# Patient Record
Sex: Female | Born: 1970 | Race: White | Hispanic: No | Marital: Married | Smoking: Never smoker
Health system: Southern US, Community
[De-identification: ages and names within clinical notes are randomized; demographics above are authoritative.]

## PROBLEM LIST (undated history)

## (undated) DIAGNOSIS — K219 Gastro-esophageal reflux disease without esophagitis: Secondary | ICD-10-CM

## (undated) DIAGNOSIS — R51 Headache: Secondary | ICD-10-CM

## (undated) DIAGNOSIS — O139 Gestational [pregnancy-induced] hypertension without significant proteinuria, unspecified trimester: Secondary | ICD-10-CM

## (undated) HISTORY — PX: WRIST SURGERY: SHX841

---

## 2012-11-10 ENCOUNTER — Telehealth: Payer: Self-pay | Admitting: Family Medicine

## 2012-11-11 NOTE — Telephone Encounter (Signed)
done

## 2012-11-21 ENCOUNTER — Other Ambulatory Visit: Payer: Self-pay

## 2012-11-24 ENCOUNTER — Encounter (HOSPITAL_COMMUNITY): Payer: Self-pay | Admitting: Obstetrics and Gynecology

## 2012-11-29 ENCOUNTER — Ambulatory Visit: Payer: Self-pay | Admitting: Family Medicine

## 2012-11-30 ENCOUNTER — Ambulatory Visit (HOSPITAL_COMMUNITY)
Admission: RE | Admit: 2012-11-30 | Discharge: 2012-11-30 | Disposition: A | Discharge status: Home / Self Care | Payer: BC Managed Care – PPO | Source: Ambulatory Visit | Attending: Obstetrics and Gynecology | Admitting: Obstetrics and Gynecology

## 2012-11-30 NOTE — Progress Notes (Signed)
Genetic Counseling  High-Risk Gestation Note  Appointment Date:  11/30/2012 Referred By: Zelphia Cairo, MD Date of Birth:  Mar 18, 1971 Partner:  Charlyne Petrin   Pregnancy History: G2P1 Estimated Date of Delivery: 07/04/13 Estimated Gestational Age: [redacted]w[redacted]d Attending: Eulis Foster, MD   Ms. Lasandra Beech and her husband, Mr. Charlyne Petrin, were seen for genetic counseling because of a maternal age of 42 y.o.Marland Kitchen     They were counseled regarding maternal age and the association with risk for chromosome conditions due to nondisjunction with aging of the ova.   We reviewed chromosomes, nondisjunction, and the associated 1 in 12 risk for fetal aneuploidy related to a maternal age of 42 y.o. at [redacted]w[redacted]d gestation.  They were counseled that the risk for aneuploidy decreases as gestational age increases, accounting for those pregnancies which spontaneously abort.  We specifically discussed Down syndrome (trisomy 7), trisomies 5 and 70, and sex chromosome aneuploidies (47,XXX and 47,XXY) including the common features and prognoses of each.   We reviewed available screening options including First Screen, Quad screen, noninvasive prenatal screening (NIPS)/cell free fetal DNA (cffDNA) testing, and detailed ultrasound.  They were counseled that screening tests are used to modify a patient's a priori risk for aneuploidy, typically based on age. This estimate provides a pregnancy specific risk assessment. We reviewed the benefits and limitations of each option. Specifically, we discussed the conditions for which each test screens, the detection rates, and false positive rates of each. They were also counseled regarding diagnostic testing via CVS and amniocentesis. We reviewed the associated risks for complications for each, including spontaneous pregnancy loss. After consideration of all the options, they were undecided regarding which option they would like to pursue.  They elected to discuss this and will notify me with  their preferences.  We again reviewed the major differences between cffDNA testing (specifically, Panorama testing) and CVS.  We also discussed the option of a nuchal translucency ultrasound at ~11-[redacted] weeks gestation and a detailed anatomy ultrasound at ~18+ weeks gestation.  They expressed interest in both.   We discussed that these ultrasounds can be performed through her primary obstetrician's office, or our office, if she prefers.   This couple was then counseled regarding Mr. Rowe's paternal age of 72.  We discussed that advanced paternal age (APA) is defined as paternal age greater than or equal to age 47.  Recent large-scale sequencing studies have shown that approximately 80% of de novo point mutations are of paternal origin.  Many studies have demonstrated a strong correlation between increased paternal age and de novo point mutations.  Although no specific data is available regarding fetal risks for fathers 2+ years old at conception, it is apparent that the overall risk for single gene conditions is increased.  To estimate the relative increase in risk of a genetic disorder with APA, the heritability of the disease must be considered.  Assuming an approximate 2x increase in risk for conditions that are exclusively paternal in origin, the risk for each individual condition is still relatively low.  It is estimated that the overall chance for a de novo mutation is ~0.5%.  We also discussed the wide range of conditions which can be caused by new dominant gene mutations (achondroplasia, neurofibromatosis, Marfan syndrome etc.).  They were counseled that genetic testing for each individual single gene condition is not warranted or available unless ultrasound or family history concerns lend suspicion to a specific condition.    In addition, we discussed that newer literature suggests that the  risk for autism spectrum disorders (ASD) may be increased in children born to fathers of APA.  We discussed that  ASDs are among the most common neurodevelopmental disorders, with approximately 1 in 85 children meeting criteria for ASD.  Approximately 80% of individuals diagnosed are female.  There is strong evidence that genetic factors play a critical role in development of ASD.  While there have been recent advances in identifying specific genetic causes of ASD, there are still many individuals for whom the etiology of the ASD is not known.  They understand that at this time there is no reliable, comprehensive genetic testing available for ASD.     Both family histories were reviewed and found to be contributory for Ms. Schifano's brother having mild intellectual disability of unknown etiology.  By report, he has some type of spinal column difference that contributes to his learning disability.  They were counseled that there are many different causes of intellectual disabilities including environmental, multifactorial, and genetic etiologies.  We discussed that a specific diagnosis for intellectual disability can be determined in approximately 50% of these individuals.  In the remaining 50% of individuals, a diagnosis may never be determined.  Regarding genetic causes, we discussed that chromosome aberrations (aneuploidy, deletions, duplications, insertions, and translocations) are responsible for a small percentage of individuals with intellectual disability.  Many individuals with chromosome aberrations have additional differences, including congenital anomalies or minor dysmorphisms.  Likewise, single gene conditions are the underlying cause of intellectual delay in some families.  We discussed that many gene conditions have intellectual disability as a feature, but also often include other physical or medical differences.  Specifically, we reviewed fragile X syndrome and the X-linked inheritance of this condition.  We discussed the option of FMR1 (the gene that when altered causes fragile X syndrome) analysis to determine  whether Fragile X syndrome is the cause of intellectual disability in this family.  In addition, we discussed the option of this family having an evaluation by a medical geneticist to help determine the cause of the familial intellectual disability. Ms. Winstanley reported that her brother has been evaluated by a geneticist and has had extensive testing, which has all been normal.   Medical records were not available to verify the reported history.  We discussed that without more specific information, it is difficult to provide an accurate risk assessment.  Further genetic counseling is warranted if more information is obtained.  In addition, Mr. Phylliss Bob reported that he has two maternal first cousins, once-removed, who have cystic fibrosis (CF).  Given this family history, Mr. Phylliss Bob has an~1/8 chance to be a carrier of CF.  We reviewed CF including the common features, the autosomal recessive inheritance, and the availability of carrier testing and prenatal diagnosis, if warranted.  The risk for the fetus to have CF is ~1 in 800.  We also discussed that CF is routinely screened for as part of the [facility] newborn screening panel.  After thoughtful consideration of their options, this couple declined testing today.  They will likely pursue carrier testing for CF when they return for either cffDNA testing or CVS.    Additionally, Mr. Phylliss Bob reported that his father and a paternal uncle died from complications of brain (intracranial) aneurysms.  We reviewed that intracranial aneurysms can be environmental, multifactorial, or genetic in etiology.  We discussed that familial intracranial aneurysms are defined as two or more first degree relatives with an intracranial aneurysm.  In these families a genetic predisposition is suspected; however,  specific environmental factors may also contribute to the occurrence of intracranial aneurysms in these families.  Mr. Phylliss Bob reported that he had imagining of his brain a few years ago in [place]  and that the results were wnl.  We discussed that while these results are reassuring, his risk for an intracranial aneurysm remains increased, given his family history.  A specific risk for an aneurysm is difficult to determine based on the provided information. Further genetic counseling is warranted if more information is obtained.  The remainder of the family histories were noncontributory for birth defects, intellectual disability, and known genetic conditions. Without further information regarding the provided family history, an accurate genetic risk cannot be calculated. Further genetic counseling is warranted if more information is obtained.  Ms. Sorenson denied exposure to environmental toxins or chemical agents. She denied the use of alcohol, tobacco or street drugs. She denied significant viral illnesses during the course of her pregnancy.   I counseled this couple regarding the above risks and available options.  The approximate face-to-face time with the genetic counselor was 70 minutes.  Donald Prose, MS Certified Genetic Counselor

## 2012-12-13 ENCOUNTER — Ambulatory Visit (HOSPITAL_COMMUNITY)
Admission: RE | Admit: 2012-12-13 | Discharge: 2012-12-13 | Disposition: A | Discharge status: Home / Self Care | Payer: BC Managed Care – PPO | Source: Ambulatory Visit | Attending: Obstetrics and Gynecology | Admitting: Obstetrics and Gynecology

## 2012-12-13 ENCOUNTER — Other Ambulatory Visit: Payer: Self-pay

## 2012-12-13 DIAGNOSIS — O99891 Other specified diseases and conditions complicating pregnancy: Secondary | ICD-10-CM | POA: Insufficient documentation

## 2012-12-15 ENCOUNTER — Inpatient Hospital Stay (HOSPITAL_COMMUNITY): Payer: BC Managed Care – PPO

## 2012-12-15 ENCOUNTER — Encounter (HOSPITAL_COMMUNITY): Payer: Self-pay

## 2012-12-15 ENCOUNTER — Encounter (HOSPITAL_COMMUNITY): Payer: Self-pay | Admitting: *Deleted

## 2012-12-15 ENCOUNTER — Telehealth (HOSPITAL_COMMUNITY): Payer: Self-pay | Admitting: MS"

## 2012-12-15 ENCOUNTER — Inpatient Hospital Stay (HOSPITAL_COMMUNITY)
Admission: AD | Admit: 2012-12-15 | Discharge: 2012-12-15 | Disposition: A | Discharge status: Home / Self Care | Payer: BC Managed Care – PPO | Source: Ambulatory Visit | Attending: Obstetrics and Gynecology | Admitting: Obstetrics and Gynecology

## 2012-12-15 DIAGNOSIS — O021 Missed abortion: Secondary | ICD-10-CM | POA: Insufficient documentation

## 2012-12-15 HISTORY — DX: Gestational (pregnancy-induced) hypertension without significant proteinuria, unspecified trimester: O13.9

## 2012-12-15 LAB — CBC
HCT: 39.9 % (ref 36.0–46.0)
MCH: 28.9 pg (ref 26.0–34.0)
Platelets: 179 10*3/uL (ref 150–400)
RDW: 13.3 % (ref 11.5–15.5)

## 2012-12-15 LAB — URINALYSIS, ROUTINE W REFLEX MICROSCOPIC
Glucose, UA: NEGATIVE mg/dL
Leukocytes, UA: NEGATIVE
Nitrite: NEGATIVE
Specific Gravity, Urine: 1.015 (ref 1.005–1.030)
pH: 8 (ref 5.0–8.0)

## 2012-12-15 NOTE — MAU Provider Note (Signed)
History     CSN: [id number]  Arrival date and time: 12/15/12 4098   First Provider Initiated Contact with Patient 12/15/12 [phone]      Chief Complaint  Patient presents with  . Vaginal Bleeding   HPI Pt is 42 you white married [place] G2P1 at 11w 1 d who presents for vaginal bleeding in pregnancy.  Pt was visiting in [place] end of August and had evaluation with small [facility]- pt has seen Dr. Vincente Poli and noted to have decrease in progesterone level and has been on progesterone.  Pt has not had sex recently.  Pt started having a little spotting when wiped last night- has had some uncomfortable feeling but not cramping- pt was able to sleep through the night.  This morning, pt continued to have light bleeding, no clots and presents for evaluation.  Pt has had viability ultrasound at Dr. Lynnell Dike with audible FHT.  No past medical history on file.  No past surgical history on file.  No family history on file.  History  Substance Use Topics  . Smoking status: Not on file  . Smokeless tobacco: Not on file  . Alcohol Use: Not on file    Allergies: Allergies not on file  No prescriptions prior to admission    Review of Systems  Constitutional: Negative for fever and chills.  Gastrointestinal: Negative for nausea, vomiting and abdominal pain.   Physical Exam   Blood pressure 136/79, pulse 87, temperature 98.4 F (36.9 C), temperature source Oral, resp. rate 16, last menstrual period 09/26/2012.  Physical Exam  Vitals reviewed. Constitutional: She is oriented to person, place, and time. She appears well-developed and well-nourished. No distress.  HENT:  Head: Normocephalic.  Eyes: Pupils are equal, round, and reactive to light.  Neck: Normal range of motion. Neck supple.  Cardiovascular: Normal rate.   Respiratory: Effort normal.  GI: Soft.  Musculoskeletal: Normal range of motion.  Neurological: She is alert and oriented to person, place, and time.  Skin: Skin is warm and dry.   Psychiatric: She has a normal mood and affect.    MAU Course  Procedures Results for orders placed during the hospital encounter of 12/15/12 (from the past 24 hour(s))  URINALYSIS, ROUTINE W REFLEX MICROSCOPIC     Status: Abnormal   Collection Time    12/15/12  8:20 AM      Result Value Range   Color, Urine YELLOW  YELLOW   APPearance HAZY (*) CLEAR   Specific Gravity, Urine 1.015  1.005 - 1.030   pH 8.0  5.0 - 8.0   Glucose, UA NEGATIVE  NEGATIVE mg/dL   Hgb urine dipstick LARGE (*) NEGATIVE   Bilirubin Urine NEGATIVE  NEGATIVE   Ketones, ur NEGATIVE  NEGATIVE mg/dL   Protein, ur NEGATIVE  NEGATIVE mg/dL   Urobilinogen, UA 0.2  0.0 - 1.0 mg/dL   Nitrite NEGATIVE  NEGATIVE   Leukocytes, UA NEGATIVE  NEGATIVE  URINE MICROSCOPIC-ADD ON     Status: Abnormal   Collection Time    12/15/12  8:20 AM      Result Value Range   Squamous Epithelial / LPF FEW (*) RARE   RBC / HPF 3-6  <3 RBC/hpf   Bacteria, UA FEW (*) RARE  CBC     Status: Abnormal   Collection Time    12/15/12  8:40 AM      Result Value Range   WBC 5.3  4.0 - 10.5 K/uL   RBC 4.98  3.87 - 5.11  MIL/uL   Hemoglobin 14.4  12.0 - 15.0 g/dL   HCT 81.1  91.4 - 78.2 %   MCV 80.1  78.0 - 100.0 fL   MCH 28.9  26.0 - 34.0 pg   MCHC 36.1 (*) 30.0 - 36.0 g/dL   RDW 95.6  21.3 - 08.6 %   Platelets 179  150 - 400 K/uL   US Ob Transvaginal  12/15/2012   CLINICAL DATA:  Vaginal bleeding. Eleven week 2 day assigned gestational age by prior outside ultrasound.  EXAM: OBSTETRIC <14 WK [place] AND TRANSVAGINAL OB US  TECHNIQUE: Both transabdominal and transvaginal ultrasound examinations were performed for complete evaluation of the gestation as well as the maternal uterus, adnexal regions, and pelvic cul-de-sac. Transvaginal technique was performed to assess early pregnancy.  COMPARISON:  None.  FINDINGS: Intrauterine gestational sac: Visualized  Yolk sac:  Visualized  Embryo:  Visualized  Cardiac Activity: Absent  Heart Rate:  0 bpm  CRL:    15  mm   8 w 0 d                  [place] EDC: 07/29/2013  Maternal uterus/adnexae: No abnormality identified. Both ovaries are normal in appearance.  IMPRESSION: Findings meet definitive criteria for failed pregnancy. This recommendation follows SRU consensus guidelines: Diagnostic Criteria for Nonviable Pregnancy Early in the First Trimester. [facility] [phone].   Electronically Signed   By: Myles Rosenthal   On: 12/15/2012 10:05  discussed with Dr. Vincente Poli who will come and see pt and discuss findings.  Assessment and Plan  Failed pregnancy  LINEBERRY,SUSAN 12/15/2012, 8:39 AM

## 2012-12-15 NOTE — Telephone Encounter (Signed)
Despina Arias, CGC called Ms. Sara Velazquez regarding reported recent miscarriage of pregnancy. Patient had elected to pursue noninvasive prenatal screening (Panorama) through [facility] on 12/13/12. [name] asked [name] if she wanted to pursue this testing or cancel this test. The benefits and limitations of this testing near the time of a miscarriage were reviewed with the patient. [name] stated that the fetal size was approximately [redacted] weeks gestation, indicating that the miscarriage likely occurred prior to the time of her blood draw on 12/13/12. The patient, thus, stated that she would like to cancel NIPS testing.    Quinn Plowman, MS Certified Genetic counselor 12/15/2012 1:17 PM

## 2012-12-15 NOTE — MAU Note (Signed)
Spotting since Tuesday, was brown.  Was slightly increased on Wednesday, became red last night.  Spotting continues today.  Feels mild discomfort in lower abd.

## 2012-12-16 ENCOUNTER — Ambulatory Visit (HOSPITAL_COMMUNITY): Payer: BC Managed Care – PPO

## 2012-12-16 ENCOUNTER — Encounter (HOSPITAL_COMMUNITY)
Admission: RE | Disposition: A | Discharge status: Home / Self Care | Payer: Self-pay | Source: Ambulatory Visit | Attending: Obstetrics and Gynecology

## 2012-12-16 ENCOUNTER — Encounter (HOSPITAL_COMMUNITY): Payer: Self-pay | Admitting: *Deleted

## 2012-12-16 ENCOUNTER — Ambulatory Visit (HOSPITAL_COMMUNITY)
Admission: RE | Admit: 2012-12-16 | Discharge: 2012-12-16 | Disposition: A | Discharge status: Home / Self Care | Payer: BC Managed Care – PPO | Source: Ambulatory Visit | Attending: Obstetrics and Gynecology | Admitting: Obstetrics and Gynecology

## 2012-12-16 ENCOUNTER — Encounter (HOSPITAL_COMMUNITY): Payer: Self-pay | Admitting: Anesthesiology

## 2012-12-16 ENCOUNTER — Encounter (HOSPITAL_COMMUNITY): Payer: Self-pay | Admitting: Pharmacy Technician

## 2012-12-16 ENCOUNTER — Ambulatory Visit (HOSPITAL_COMMUNITY): Payer: BC Managed Care – PPO | Admitting: Anesthesiology

## 2012-12-16 DIAGNOSIS — O021 Missed abortion: Secondary | ICD-10-CM | POA: Insufficient documentation

## 2012-12-16 HISTORY — PX: DILATION AND EVACUATION: SHX1459

## 2012-12-16 HISTORY — DX: Headache: R51

## 2012-12-16 HISTORY — DX: Gastro-esophageal reflux disease without esophagitis: K21.9

## 2012-12-16 SURGERY — DILATION AND EVACUATION, UTERUS
Anesthesia: Monitor Anesthesia Care | Site: Abdomen | Wound class: Clean Contaminated

## 2012-12-16 MED ORDER — PROMETHAZINE HCL 25 MG/ML IJ SOLN: 6.2500 mg | INTRAMUSCULAR | Status: DC | PRN

## 2012-12-16 MED ORDER — PROPOFOL 10 MG/ML IV EMUL
INTRAVENOUS | Status: DC | PRN
  Administered 2012-12-16 (×5): 40 mg via INTRAVENOUS

## 2012-12-16 MED ORDER — KETOROLAC TROMETHAMINE 30 MG/ML IJ SOLN
INTRAMUSCULAR | Status: DC | PRN
  Administered 2012-12-16: 30 mg via INTRAVENOUS

## 2012-12-16 MED ORDER — MIDAZOLAM HCL 2 MG/2ML IJ SOLN
INTRAMUSCULAR | Status: DC | PRN
  Administered 2012-12-16: 2 mg via INTRAVENOUS

## 2012-12-16 MED ORDER — LACTATED RINGERS IV SOLN
INTRAVENOUS | Status: DC
  Administered 2012-12-16: 100 mL/h via INTRAVENOUS

## 2012-12-16 MED ORDER — DEXTROSE 5 % IV SOLN
2.0000 g | INTRAVENOUS | Status: AC
  Administered 2012-12-16: 2 g via INTRAVENOUS
  Filled 2012-12-16: qty 2

## 2012-12-16 MED ORDER — FENTANYL CITRATE 0.05 MG/ML IJ SOLN: 25.0000 ug | INTRAMUSCULAR | Status: DC | PRN

## 2012-12-16 MED ORDER — LIDOCAINE HCL (CARDIAC) 20 MG/ML IV SOLN
INTRAVENOUS | Status: AC
  Filled 2012-12-16: qty 5

## 2012-12-16 MED ORDER — OXYCODONE-ACETAMINOPHEN 5-325 MG PO TABS: 1.0000 | ORAL_TABLET | ORAL | Status: AC | PRN

## 2012-12-16 MED ORDER — FENTANYL CITRATE 0.05 MG/ML IJ SOLN
INTRAMUSCULAR | Status: AC
  Filled 2012-12-16: qty 2

## 2012-12-16 MED ORDER — ONDANSETRON HCL 4 MG/2ML IJ SOLN
INTRAMUSCULAR | Status: AC
  Filled 2012-12-16: qty 2

## 2012-12-16 MED ORDER — KETOROLAC TROMETHAMINE 30 MG/ML IJ SOLN
INTRAMUSCULAR | Status: AC
  Filled 2012-12-16: qty 1

## 2012-12-16 MED ORDER — ONDANSETRON HCL 4 MG/2ML IJ SOLN
INTRAMUSCULAR | Status: DC | PRN
  Administered 2012-12-16: 4 mg via INTRAVENOUS

## 2012-12-16 MED ORDER — IBUPROFEN 200 MG PO TABS: 600.0000 mg | ORAL_TABLET | Freq: Four times a day (QID) | ORAL | Status: AC | PRN

## 2012-12-16 MED ORDER — MIDAZOLAM HCL 2 MG/2ML IJ SOLN
INTRAMUSCULAR | Status: AC
  Filled 2012-12-16: qty 2

## 2012-12-16 MED ORDER — MEPERIDINE HCL 25 MG/ML IJ SOLN: 6.2500 mg | INTRAMUSCULAR | Status: DC | PRN

## 2012-12-16 MED ORDER — PROPOFOL 10 MG/ML IV EMUL
INTRAVENOUS | Status: AC
  Filled 2012-12-16: qty 20

## 2012-12-16 MED ORDER — CHLOROPROCAINE HCL 1 % IJ SOLN
INTRAMUSCULAR | Status: DC | PRN
  Administered 2012-12-16: 10 mL

## 2012-12-16 MED ORDER — MIDAZOLAM HCL 2 MG/2ML IJ SOLN: 0.5000 mg | Freq: Once | INTRAMUSCULAR | Status: DC | PRN

## 2012-12-16 MED ORDER — KETOROLAC TROMETHAMINE 30 MG/ML IJ SOLN: 15.0000 mg | Freq: Once | INTRAMUSCULAR | Status: DC | PRN

## 2012-12-16 MED ORDER — LIDOCAINE HCL (CARDIAC) 20 MG/ML IV SOLN
INTRAVENOUS | Status: DC | PRN
  Administered 2012-12-16: 50 mg via INTRAVENOUS

## 2012-12-16 MED ORDER — FENTANYL CITRATE 0.05 MG/ML IJ SOLN
INTRAMUSCULAR | Status: DC | PRN
  Administered 2012-12-16 (×2): 50 ug via INTRAVENOUS

## 2012-12-16 SURGICAL SUPPLY — 20 items
CATH ROBINSON RED A/P 16FR (CATHETERS) ×2 IMPLANT
CLOTH BEACON ORANGE TIMEOUT ST (SAFETY) ×2 IMPLANT
DECANTER SPIKE VIAL GLASS SM (MISCELLANEOUS) ×2 IMPLANT
GLOVE BIO SURGEON STRL SZ 6.5 (GLOVE) ×2 IMPLANT
GLOVE BIOGEL PI IND STRL 7.0 (GLOVE) ×1 IMPLANT
GLOVE BIOGEL PI INDICATOR 7.0 (GLOVE) ×1
GOWN STRL REIN XL XLG (GOWN DISPOSABLE) ×4 IMPLANT
KIT BERKELEY 1ST TRIMESTER 3/8 (MISCELLANEOUS) ×2 IMPLANT
NEEDLE SPNL 22GX3.5 QUINCKE BK (NEEDLE) ×2 IMPLANT
NS IRRIG 1000ML POUR BTL (IV SOLUTION) ×2 IMPLANT
PACK VAGINAL MINOR WOMEN LF (CUSTOM PROCEDURE TRAY) ×2 IMPLANT
PAD OB MATERNITY 4.3X12.25 (PERSONAL CARE ITEMS) ×2 IMPLANT
PAD PREP 24X48 CUFFED NSTRL (MISCELLANEOUS) ×2 IMPLANT
SET BERKELEY SUCTION TUBING (SUCTIONS) ×2 IMPLANT
SYR CONTROL 10ML LL (SYRINGE) ×2 IMPLANT
TOWEL OR 17X24 6PK STRL BLUE (TOWEL DISPOSABLE) ×4 IMPLANT
VACURETTE 10 RIGID CVD (CANNULA) IMPLANT
VACURETTE 7MM CVD STRL WRAP (CANNULA) IMPLANT
VACURETTE 8 RIGID CVD (CANNULA) IMPLANT
VACURETTE 9 RIGID CVD (CANNULA) IMPLANT

## 2012-12-16 NOTE — Anesthesia Postprocedure Evaluation (Signed)
  Anesthesia Post Note  Patient: Sara Velazquez  Procedure(s) Performed: Procedure(s) (LRB): DILATATION AND EVACUATION (N/A)  Anesthesia type: MAC  Patient location: PACU  Post pain: Pain level controlled  Post assessment: Post-op Vital signs reviewed  Last Vitals:  Filed Vitals:   12/16/12 1431  BP: 108/56  Pulse:   Temp: 37.1 C  Resp: 18    Post vital signs: Reviewed  Level of consciousness: sedated  Complications: No apparent anesthesia complications

## 2012-12-16 NOTE — Anesthesia Preprocedure Evaluation (Signed)
Anesthesia Evaluation  Patient identified by MRN, date of birth, ID band Patient awake    Reviewed: Allergy & Precautions, H&P , Patient's Chart, lab work & pertinent test results, reviewed documented beta blocker date and time   History of Anesthesia Complications Negative for: history of anesthetic complications  Airway Mallampati: II TM Distance: >3 FB Neck ROM: full    Dental no notable dental hx.    Pulmonary neg pulmonary ROS,  breath sounds clear to auscultation  Pulmonary exam normal       Cardiovascular Exercise Tolerance: Good negative cardio ROS  Rhythm:regular Rate:Normal     Neuro/Psych negative neurological ROS  negative psych ROS   GI/Hepatic negative GI ROS, Neg liver ROS, GERD-  ,  Endo/Other  negative endocrine ROS  Renal/GU negative Renal ROS     Musculoskeletal   Abdominal   Peds  Hematology negative hematology ROS (+)   Anesthesia Other Findings   Reproductive/Obstetrics negative OB ROS                           Anesthesia Physical Anesthesia Plan  ASA: II  Anesthesia Plan: MAC   Post-op Pain Management:    Induction:   Airway Management Planned:   Additional Equipment:   Intra-op Plan:   Post-operative Plan:   Informed Consent: I have reviewed the patients History and Physical, chart, labs and discussed the procedure including the risks, benefits and alternatives for the proposed anesthesia with the patient or authorized representative who has indicated his/her understanding and acceptance.   Dental Advisory Given  Plan Discussed with: CRNA, Surgeon and Anesthesiologist  Anesthesia Plan Comments:         Anesthesia Quick Evaluation

## 2012-12-16 NOTE — Transfer of Care (Signed)
Immediate Anesthesia Transfer of Care Note  Patient: Sara Velazquez  Procedure(s) Performed: Procedure(s): DILATATION AND EVACUATION (N/A)  Patient Location: PACU  Anesthesia Type:MAC  Level of Consciousness: awake, alert  and oriented  Airway & Oxygen Therapy: Patient Spontanous Breathing  Post-op Assessment: Report given to PACU RN and Post -op Vital signs reviewed and stable  Post vital signs: Reviewed and stable  Complications: No apparent anesthesia complications

## 2012-12-16 NOTE — H&P (Signed)
42 yo [redacted]wks gestation by LMP presented to MAU yesterday and dx with MAB.  Presents today for surgical mngt.    No past medical history on file.  No past surgical history on file.  No family history on file.  History   Substance Use Topics   .  Smoking status:  Not on file   .  Smokeless tobacco:  Not on file   .  Alcohol Use:  Not on file    Allergies: Allergies not on file  No prescriptions prior to admission    Review of Systems  Constitutional: Negative for fever and chills.  Gastrointestinal: Negative for nausea, vomiting and abdominal pain.   Physical Exam   Blood pressure 136/79, pulse 87, temperature 98.4 F (36.9 C), temperature source Oral, resp. rate 16, last menstrual period 09/26/2012.  Physical Exam  Vitals reviewed.  Constitutional: She is oriented to person, place, and time. She appears well-developed and well-nourished. No distress.  HENT:  Head: Normocephalic.  Eyes: Pupils are equal, round, and reactive to light.  Neck: Normal range of motion. Neck supple.  Cardiovascular: Normal rate.  Respiratory: Effort normal.  GI: Soft.  Musculoskeletal: Normal range of motion.  Neurological: She is alert and oriented to person, place, and time.  Skin: Skin is warm and dry.  Psychiatric: She has a normal mood and affect.   [place]:  IUP measuring 8 wks, No FHT  A/P :  Missed ab Plan for D&E with genetic studies

## 2012-12-17 NOTE — Op Note (Signed)
NAMEMEILA, BERKE                ACCOUNT NO.:  [id number]  MEDICAL RECORD NO.:  [id number]  LOCATION:  [facility]                          FACILITY:  [facility]  PHYSICIAN:  Zelphia Cairo, MD    DATE OF BIRTH:  1970/09/12  DATE OF PROCEDURE: DATE OF DISCHARGE:  12/16/2012                              OPERATIVE REPORT   PREOPERATIVE DIAGNOSIS:  Missed abortion.  POSTOPERATIVE DIAGNOSIS:  Missed abortion.  PROCEDURES: 1. Cervical block. 2. Dilation and curettage.  SURGEON:  Zelphia Cairo, MD  ANESTHESIA:  MAC with local.  SPECIMEN:  Products of conception.  CONDITION:  Stable to recovery room.  COMPLICATIONS:  None.  DESCRIPTION OF PROCEDURE:  The patient was taken to the operating room where anesthesia was obtained, she was placed in the dorsal lithotomy position using Allen stirrups.  She was prepped and draped in sterile fashion and an in-and-out catheter was used to drain her bladder for scant urine.  Bivalve speculum was placed in the vagina and 1 mL of 1% Nesacaine was used at the anterior lip of the cervix.  Single-tooth tenaculum was attached to the anterior lip of the cervix.  An 8 mL was then used to perform a cervical block.  The cervix was then serially dilated using Pratt dilators and an 8-French suction curette was used to remove products of conception.  A gentle curetting was then performed with a ring curette until the uterine cry was noted throughout.  Suction curette was reinserted to remove any clots and debris.  No further tissue was removed.  The tenaculum was removed from the cervix.  The cervix was hemostatic.  Speculum was removed.  The sponge lap, and needle counts were correct.  She was taken to the recovery room in stable condition.     Zelphia Cairo, MD    GA/MEDQ  D:  12/16/2012  T:  12/17/2012  Job:  [id number]

## 2012-12-19 ENCOUNTER — Encounter (HOSPITAL_COMMUNITY): Payer: Self-pay | Admitting: Obstetrics and Gynecology

## 2013-10-20 ENCOUNTER — Encounter (HOSPITAL_COMMUNITY): Payer: Self-pay | Admitting: *Deleted

## 2014-01-17 ENCOUNTER — Other Ambulatory Visit: Payer: Self-pay | Admitting: Family Medicine

## 2014-01-17 DIAGNOSIS — Z1239 Encounter for other screening for malignant neoplasm of breast: Secondary | ICD-10-CM

## 2014-02-05 ENCOUNTER — Encounter (HOSPITAL_COMMUNITY): Payer: Self-pay | Admitting: *Deleted

## 2014-02-16 ENCOUNTER — Other Ambulatory Visit: Payer: Self-pay | Admitting: Family Medicine

## 2014-02-16 DIAGNOSIS — Z1231 Encounter for screening mammogram for malignant neoplasm of breast: Secondary | ICD-10-CM

## 2014-02-19 ENCOUNTER — Encounter (INDEPENDENT_AMBULATORY_CARE_PROVIDER_SITE_OTHER): Payer: Self-pay

## 2014-02-19 ENCOUNTER — Ambulatory Visit
Admission: RE | Admit: 2014-02-19 | Discharge: 2014-02-19 | Disposition: A | Discharge status: Home / Self Care | Payer: BC Managed Care – PPO | Source: Ambulatory Visit | Attending: Family Medicine | Admitting: Family Medicine

## 2014-02-19 DIAGNOSIS — Z1231 Encounter for screening mammogram for malignant neoplasm of breast: Secondary | ICD-10-CM

## 2014-04-17 ENCOUNTER — Other Ambulatory Visit: Payer: Self-pay | Admitting: Dermatology

## 2014-06-21 IMAGING — US US OB COMP LESS 14 WK
2 series · 14 of 28 positions shown · non-contrast
Comparison: None.

CLINICAL DATA: Vaginal bleeding. Eleven week 2 day assigned
gestational age by prior outside ultrasound.

EXAM:
OBSTETRIC <14 WK US AND TRANSVAGINAL OB US
TECHNIQUE: Both transabdominal and transvaginal ultrasound examinations were
performed for complete evaluation of the gestation as well as the
maternal uterus, adnexal regions, and pelvic cul-de-sac.
Transvaginal technique was performed to assess early pregnancy.

[Series 1: us ob comp less 14 wks · 7 of 44 slices shown (1 of 2)]
[im 4/44]
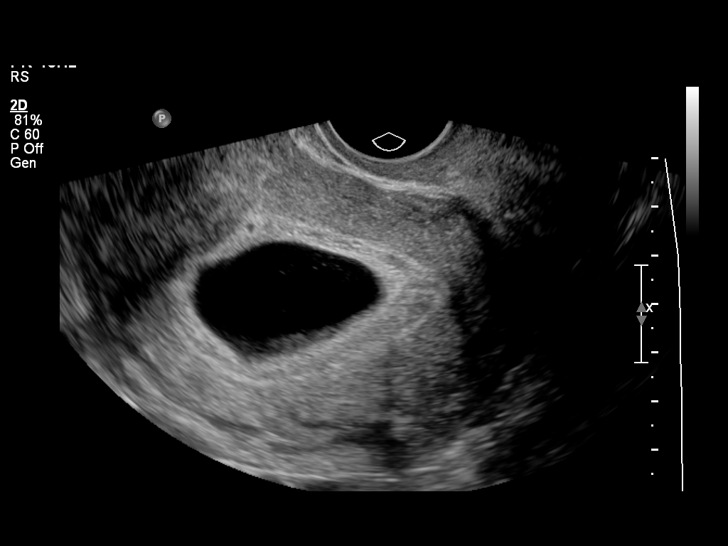
[im 10/44]
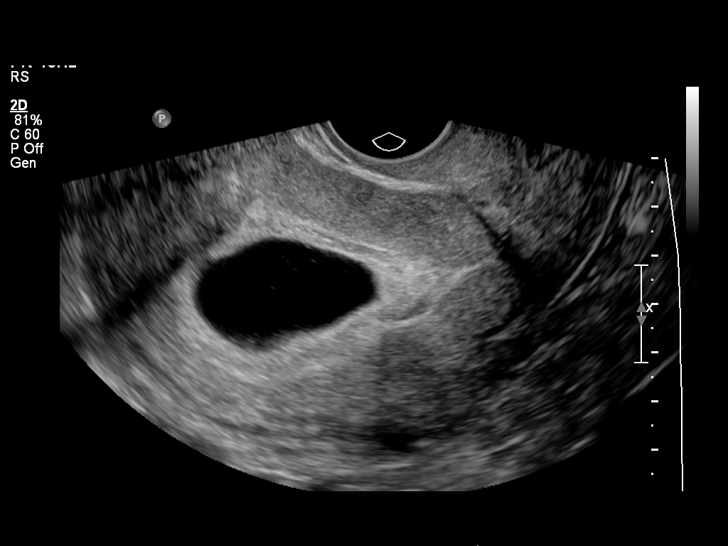
[im 17/44]
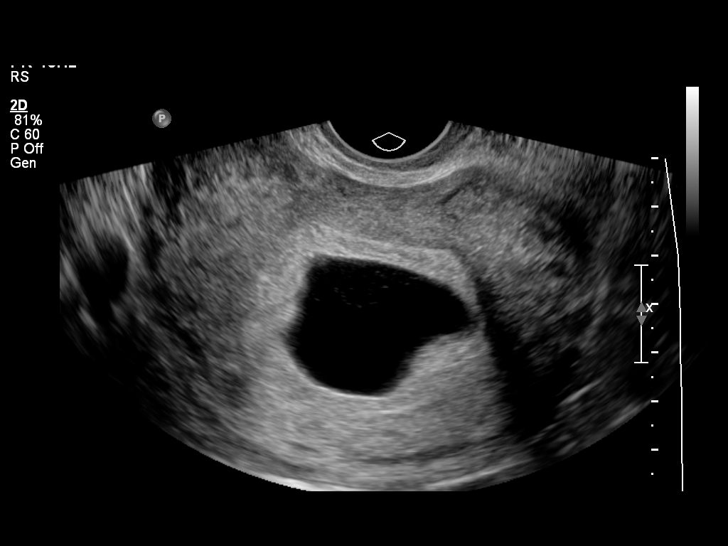
[im 24/44]
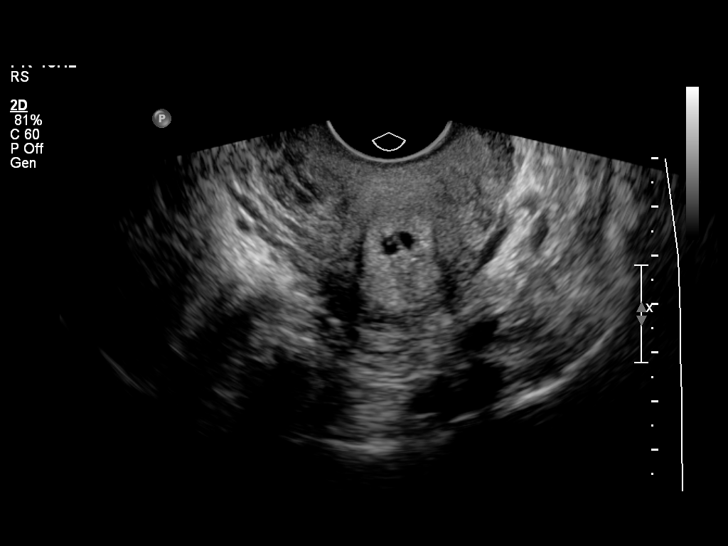
[im 30/44]
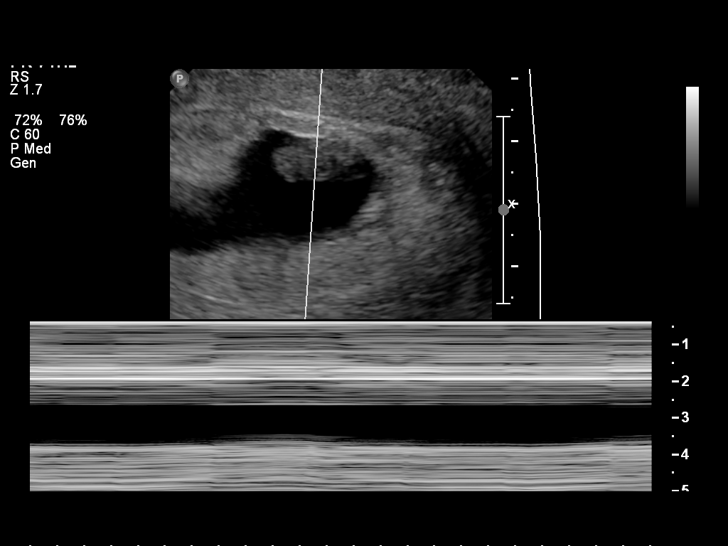
[im 37/44]
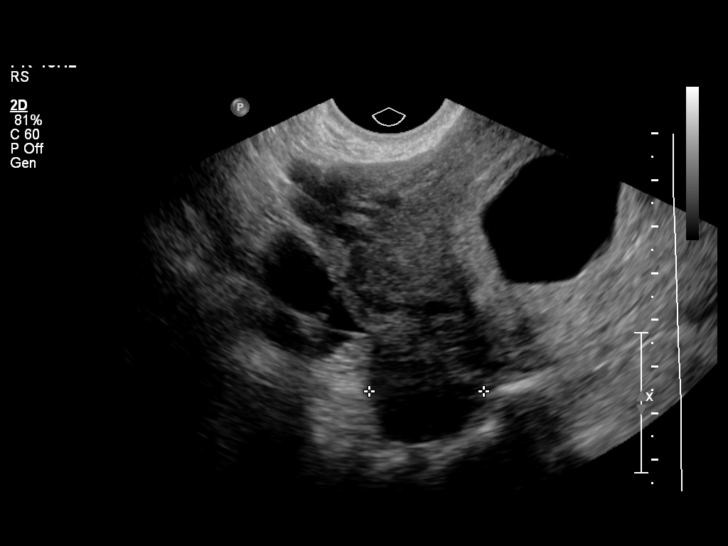
[im 44/44]
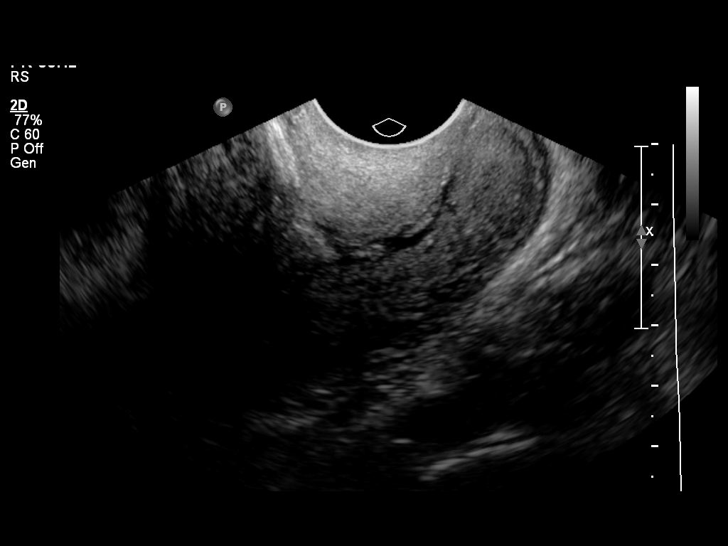

[Series 1: us ob comp less 14 wks · 46 acquisitions, 7 frames shown (2 of 2)]
[im 4/46]
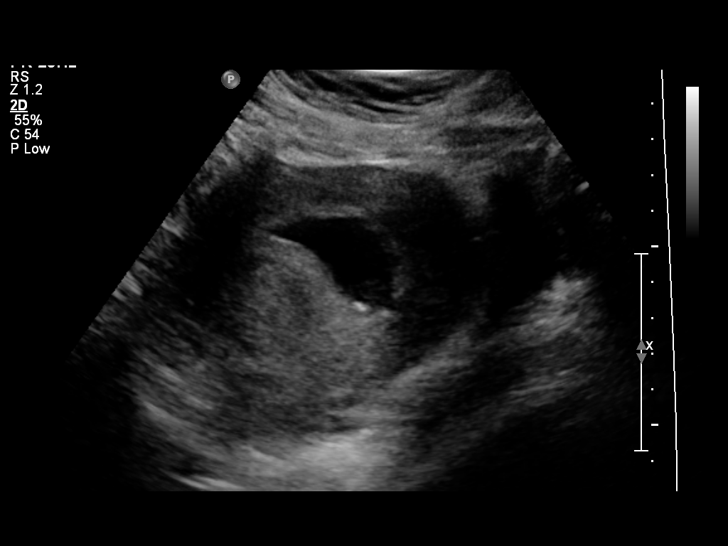
[im 11/46]
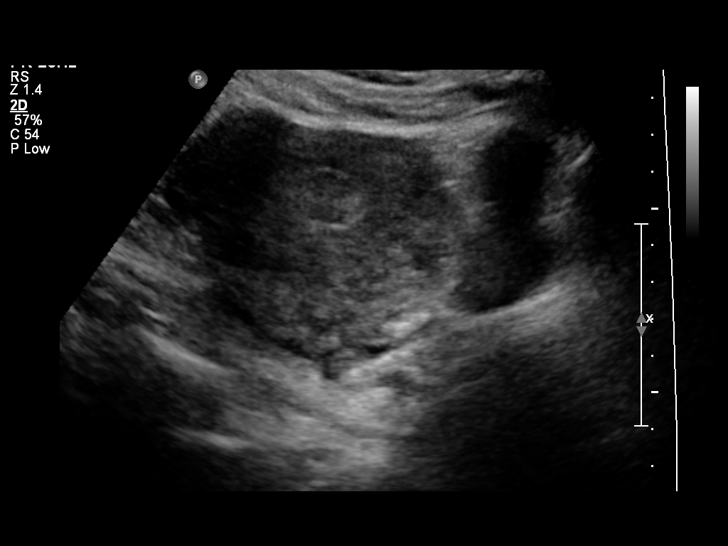
[im 18/46]
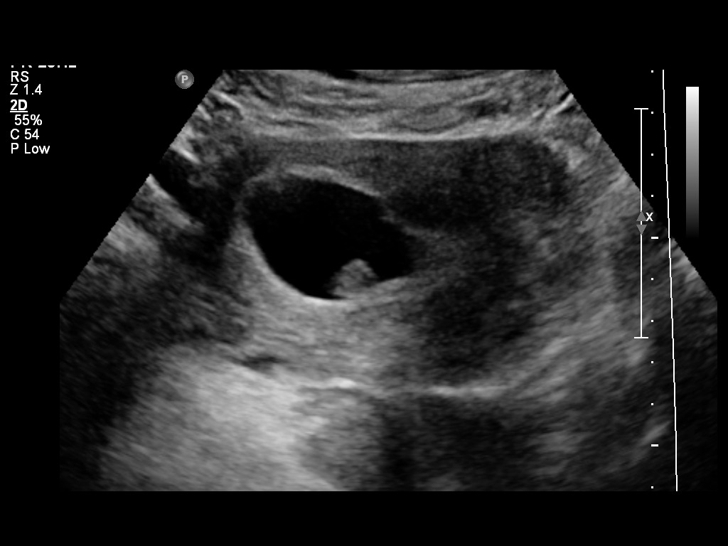
[im 25/46]
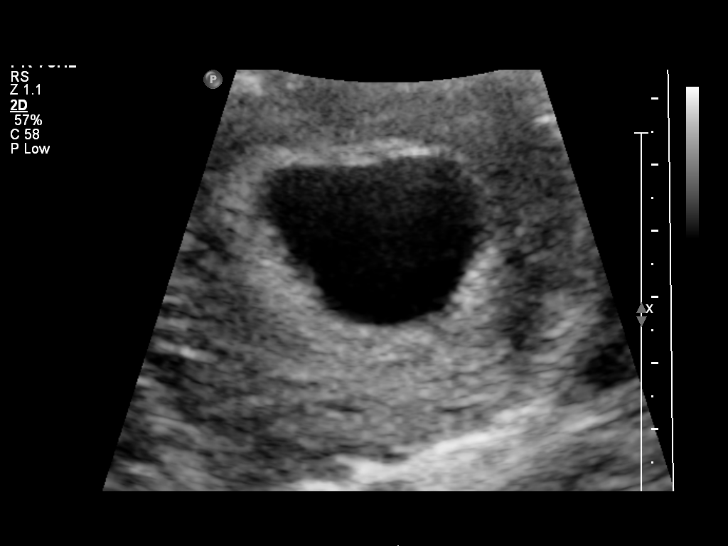
[im 32/46]
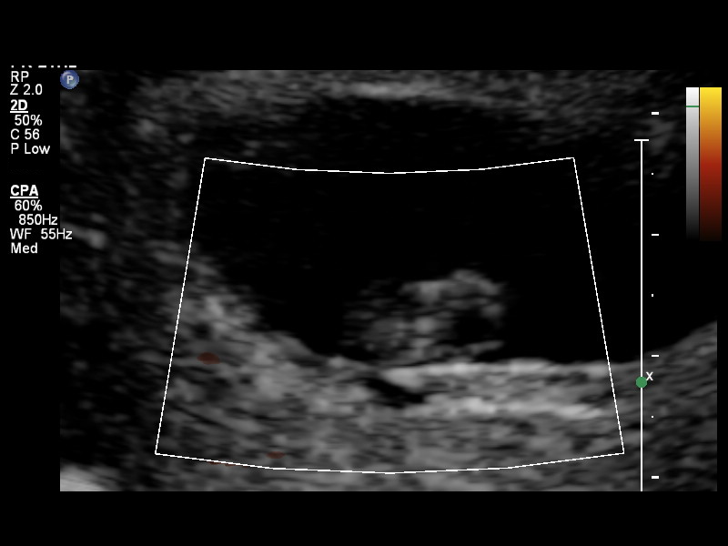
[im 39/46]
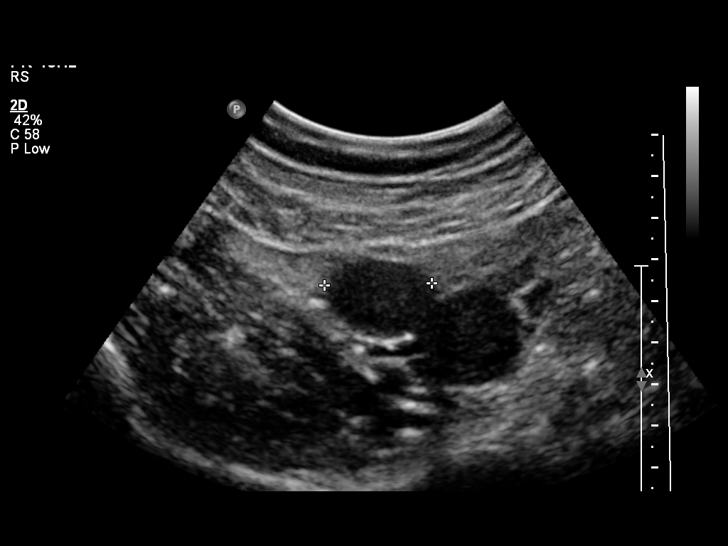
[im 46/46]
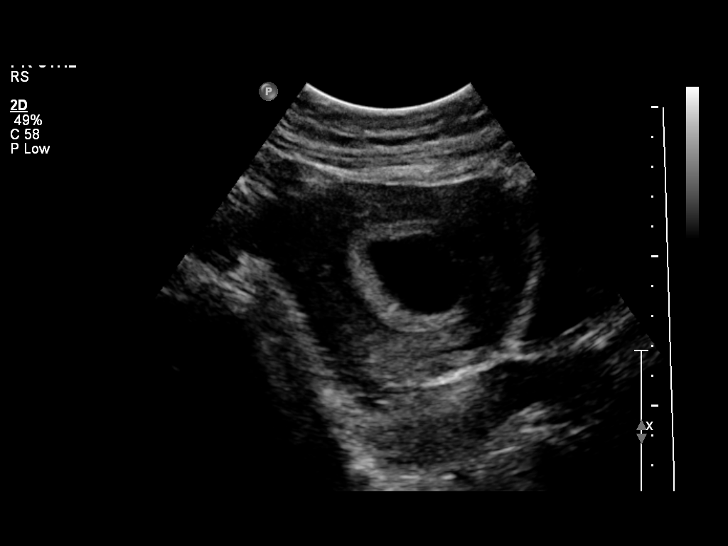

[14 of 28 positions shown; findings below may reference images not displayed]

FINDINGS: Intrauterine gestational sac: Visualized

Yolk sac: Visualized

Embryo: Visualized

Cardiac Activity: Absent

Heart Rate: 0 bpm

CRL: 15 mm 8 w 0 d US EDC: 07/29/2013

Maternal uterus/adnexae: No abnormality identified. Both ovaries are
normal in appearance.
IMPRESSION: Findings meet definitive criteria for failed pregnancy. This
recommendation follows SRU consensus guidelines: Diagnostic Criteria
for Nonviable Pregnancy Early in the First Trimester. N Engl J Med

## 2017-02-23 ENCOUNTER — Other Ambulatory Visit: Payer: Self-pay | Admitting: Family Medicine

## 2017-02-23 DIAGNOSIS — Z139 Encounter for screening, unspecified: Secondary | ICD-10-CM

## 2017-03-26 ENCOUNTER — Ambulatory Visit
Admission: RE | Admit: 2017-03-26 | Discharge: 2017-03-26 | Disposition: A | Discharge status: Home / Self Care | Payer: BLUE CROSS/BLUE SHIELD | Source: Ambulatory Visit | Attending: Family Medicine | Admitting: Family Medicine

## 2017-03-26 DIAGNOSIS — Z139 Encounter for screening, unspecified: Secondary | ICD-10-CM

## 2018-05-23 ENCOUNTER — Other Ambulatory Visit: Payer: Self-pay | Admitting: Family Medicine

## 2018-05-23 DIAGNOSIS — Z1231 Encounter for screening mammogram for malignant neoplasm of breast: Secondary | ICD-10-CM

## 2018-05-24 ENCOUNTER — Ambulatory Visit: Payer: BLUE CROSS/BLUE SHIELD

## 2018-06-06 ENCOUNTER — Ambulatory Visit: Payer: BLUE CROSS/BLUE SHIELD

## 2019-11-28 ENCOUNTER — Other Ambulatory Visit: Payer: Self-pay

## 2019-11-28 ENCOUNTER — Other Ambulatory Visit: Payer: BLUE CROSS/BLUE SHIELD

## 2019-11-28 DIAGNOSIS — Z20822 Contact with and (suspected) exposure to covid-19: Secondary | ICD-10-CM

## 2019-11-29 LAB — SARS-COV-2, NAA 2 DAY TAT

## 2019-11-29 LAB — NOVEL CORONAVIRUS, NAA: SARS-CoV-2, NAA: NOT DETECTED

## 2020-01-26 ENCOUNTER — Ambulatory Visit: Payer: BLUE CROSS/BLUE SHIELD | Attending: Internal Medicine

## 2020-01-26 DIAGNOSIS — Z23 Encounter for immunization: Secondary | ICD-10-CM

## 2020-01-26 NOTE — Progress Notes (Signed)
   Covid-19 Vaccination Clinic  Name:  Sara Velazquez    MRN: [id number] DOB: January 07, 1971  01/26/2020  Sara Velazquez was observed post Covid-19 immunization for 15 minutes without incident. She was provided with Vaccine Information Sheet and instruction to access the V-Safe system.   Sara Velazquez was instructed to call 911 with any severe reactions post vaccine: Marland Kitchen Difficulty breathing  . Swelling of face and throat  . A fast heartbeat  . A bad rash all over body  . Dizziness and weakness

## 2021-08-11 DIAGNOSIS — I10 Essential (primary) hypertension: Secondary | ICD-10-CM | POA: Diagnosis not present

## 2021-08-11 DIAGNOSIS — E669 Obesity, unspecified: Secondary | ICD-10-CM | POA: Diagnosis not present

## 2021-08-11 DIAGNOSIS — B353 Tinea pedis: Secondary | ICD-10-CM | POA: Diagnosis not present

## 2021-08-11 DIAGNOSIS — Z6832 Body mass index (BMI) 32.0-32.9, adult: Secondary | ICD-10-CM | POA: Diagnosis not present

## 2022-01-07 DIAGNOSIS — J029 Acute pharyngitis, unspecified: Secondary | ICD-10-CM | POA: Diagnosis not present

## 2022-01-15 DIAGNOSIS — U071 COVID-19: Secondary | ICD-10-CM | POA: Diagnosis not present

## 2022-01-15 DIAGNOSIS — H672 Otitis media in diseases classified elsewhere, left ear: Secondary | ICD-10-CM | POA: Diagnosis not present

## 2022-04-27 DIAGNOSIS — Z79899 Other long term (current) drug therapy: Secondary | ICD-10-CM | POA: Diagnosis not present

## 2022-04-27 DIAGNOSIS — C44319 Basal cell carcinoma of skin of other parts of face: Secondary | ICD-10-CM | POA: Diagnosis not present

## 2022-04-27 DIAGNOSIS — I10 Essential (primary) hypertension: Secondary | ICD-10-CM | POA: Diagnosis not present

## 2022-04-27 DIAGNOSIS — Z6831 Body mass index (BMI) 31.0-31.9, adult: Secondary | ICD-10-CM | POA: Diagnosis not present

## 2022-04-27 DIAGNOSIS — Z13220 Encounter for screening for lipoid disorders: Secondary | ICD-10-CM | POA: Diagnosis not present

## 2022-05-07 DIAGNOSIS — C44319 Basal cell carcinoma of skin of other parts of face: Secondary | ICD-10-CM | POA: Diagnosis not present

## 2022-05-07 DIAGNOSIS — D485 Neoplasm of uncertain behavior of skin: Secondary | ICD-10-CM | POA: Diagnosis not present

## 2022-05-18 DIAGNOSIS — C44319 Basal cell carcinoma of skin of other parts of face: Secondary | ICD-10-CM | POA: Diagnosis not present

## 2022-05-20 DIAGNOSIS — I10 Essential (primary) hypertension: Secondary | ICD-10-CM | POA: Diagnosis not present

## 2022-05-20 DIAGNOSIS — Z79899 Other long term (current) drug therapy: Secondary | ICD-10-CM | POA: Diagnosis not present

## 2022-05-20 DIAGNOSIS — Z13220 Encounter for screening for lipoid disorders: Secondary | ICD-10-CM | POA: Diagnosis not present

## 2022-05-25 DIAGNOSIS — E538 Deficiency of other specified B group vitamins: Secondary | ICD-10-CM | POA: Diagnosis not present

## 2022-05-25 DIAGNOSIS — C44319 Basal cell carcinoma of skin of other parts of face: Secondary | ICD-10-CM | POA: Diagnosis not present

## 2022-05-25 DIAGNOSIS — K30 Functional dyspepsia: Secondary | ICD-10-CM | POA: Diagnosis not present

## 2022-05-25 DIAGNOSIS — D259 Leiomyoma of uterus, unspecified: Secondary | ICD-10-CM | POA: Diagnosis not present

## 2022-05-25 DIAGNOSIS — E669 Obesity, unspecified: Secondary | ICD-10-CM | POA: Diagnosis not present

## 2022-05-25 DIAGNOSIS — B353 Tinea pedis: Secondary | ICD-10-CM | POA: Diagnosis not present

## 2022-05-25 DIAGNOSIS — Z1211 Encounter for screening for malignant neoplasm of colon: Secondary | ICD-10-CM | POA: Diagnosis not present

## 2022-05-25 DIAGNOSIS — Z0000 Encounter for general adult medical examination without abnormal findings: Secondary | ICD-10-CM | POA: Diagnosis not present

## 2022-05-25 DIAGNOSIS — E782 Mixed hyperlipidemia: Secondary | ICD-10-CM | POA: Diagnosis not present

## 2022-05-25 DIAGNOSIS — Z6831 Body mass index (BMI) 31.0-31.9, adult: Secondary | ICD-10-CM | POA: Diagnosis not present

## 2022-05-25 DIAGNOSIS — I10 Essential (primary) hypertension: Secondary | ICD-10-CM | POA: Diagnosis not present

## 2022-12-25 DIAGNOSIS — M546 Pain in thoracic spine: Secondary | ICD-10-CM | POA: Diagnosis not present

## 2022-12-25 DIAGNOSIS — Z6831 Body mass index (BMI) 31.0-31.9, adult: Secondary | ICD-10-CM | POA: Diagnosis not present

## 2022-12-25 DIAGNOSIS — G8929 Other chronic pain: Secondary | ICD-10-CM | POA: Diagnosis not present

## 2023-03-11 DIAGNOSIS — Z08 Encounter for follow-up examination after completed treatment for malignant neoplasm: Secondary | ICD-10-CM | POA: Diagnosis not present

## 2023-03-11 DIAGNOSIS — L814 Other melanin hyperpigmentation: Secondary | ICD-10-CM | POA: Diagnosis not present

## 2023-03-11 DIAGNOSIS — L910 Hypertrophic scar: Secondary | ICD-10-CM | POA: Diagnosis not present

## 2023-03-11 DIAGNOSIS — L821 Other seborrheic keratosis: Secondary | ICD-10-CM | POA: Diagnosis not present

## 2023-03-11 DIAGNOSIS — D225 Melanocytic nevi of trunk: Secondary | ICD-10-CM | POA: Diagnosis not present

## 2023-03-11 DIAGNOSIS — Z85828 Personal history of other malignant neoplasm of skin: Secondary | ICD-10-CM | POA: Diagnosis not present
